# Patient Record
Sex: Female | Born: 2000 | Race: Black or African American | Hispanic: No | Marital: Single | Smoking: Never smoker
Health system: Southern US, Community
[De-identification: ages and names within clinical notes are randomized; demographics above are authoritative.]

## PROBLEM LIST (undated history)

## (undated) DIAGNOSIS — E669 Obesity, unspecified: Secondary | ICD-10-CM

## (undated) DIAGNOSIS — J45909 Unspecified asthma, uncomplicated: Secondary | ICD-10-CM

## (undated) HISTORY — DX: Unspecified asthma, uncomplicated: J45.909

## (undated) HISTORY — DX: Obesity, unspecified: E66.9

---
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
## 2020-09-19 ENCOUNTER — Other Ambulatory Visit: Payer: Self-pay

## 2020-09-19 ENCOUNTER — Ambulatory Visit (INDEPENDENT_AMBULATORY_CARE_PROVIDER_SITE_OTHER): Payer: Medicaid Other | Admitting: Pediatrics

## 2020-09-19 ENCOUNTER — Other Ambulatory Visit (HOSPITAL_COMMUNITY)
Admission: RE | Admit: 2020-09-19 | Discharge: 2020-09-19 | Disposition: A | Discharge status: Home / Self Care | Payer: Medicaid Other | Source: Ambulatory Visit | Attending: Pediatrics | Admitting: Pediatrics

## 2020-09-19 VITALS — BP 112/78 | HR 82 | Ht 66.54 in | Wt 185.8 lb

## 2020-09-19 DIAGNOSIS — A6004 Herpesviral vulvovaginitis: Secondary | ICD-10-CM | POA: Diagnosis not present

## 2020-09-19 DIAGNOSIS — Z113 Encounter for screening for infections with a predominantly sexual mode of transmission: Secondary | ICD-10-CM | POA: Insufficient documentation

## 2020-09-19 DIAGNOSIS — R55 Syncope and collapse: Secondary | ICD-10-CM

## 2020-09-19 DIAGNOSIS — E6609 Other obesity due to excess calories: Secondary | ICD-10-CM | POA: Diagnosis not present

## 2020-09-19 DIAGNOSIS — Z1389 Encounter for screening for other disorder: Secondary | ICD-10-CM | POA: Diagnosis not present

## 2020-09-19 DIAGNOSIS — Z2821 Immunization not carried out because of patient refusal: Secondary | ICD-10-CM

## 2020-09-19 DIAGNOSIS — Z00121 Encounter for routine child health examination with abnormal findings: Secondary | ICD-10-CM | POA: Diagnosis not present

## 2020-09-19 LAB — POCT URINALYSIS DIPSTICK
Bilirubin, UA: NEGATIVE
Blood, UA: POSITIVE
Glucose, UA: NEGATIVE
Ketones, UA: NEGATIVE
Nitrite, UA: NEGATIVE
Protein, UA: POSITIVE — AB
Spec Grav, UA: 1.025 (ref 1.010–1.025)
Urobilinogen, UA: NEGATIVE E.U./dL — AB
pH, UA: 6 (ref 5.0–8.0)

## 2020-09-19 LAB — POCT RAPID HIV: Rapid HIV, POC: NEGATIVE

## 2020-09-19 LAB — LIPID PANEL
Total CHOL/HDL Ratio: 3.3 (calc) (ref <5.0)
Triglycerides: 61 mg/dL (ref <90)

## 2020-09-19 MED ORDER — VALACYCLOVIR HCL 1 G PO TABS: ORAL_TABLET | ORAL | 3 refills | Status: DC

## 2020-09-19 NOTE — Progress Notes (Signed)
Adolescent Well Care Visit Eileen Fox is a 20 y.o. female who is here for well care.     PCP:  Marca Ancona, MD (Inactive)   History was provided by the patient.  Confidentiality was discussed with the patient and, if applicable, with caregiver.[phone]   Current Issues: Current concerns include  No longer actively trying to get pregnant. Decided to wait a bit. Feels that she is stable financially (has her own house and car) but that feels waiting my be best.  Partner is supportive. She is 28.  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  Most concerning is episodes of syncope x 2. First 3/21. Then 2 months ago. Describes that she was standing in the kitchen cooking, felt palpitations and dizziness and fell over. Girlfriend states she completely passed out. Woke up and had some tunnel vision. No inciting event (blood, fear, or something that triggered a vasovagal). Otherwise she feels well. Does not feel palpitations usually.   Nutrition: Nutrition/Eating Behaviors: wide variety Adequate calcium in diet?: yes  Sleep:  Sleep: 8 hours  Social Screening: Lives with:  girlfriend Parental relations:  good (mom) but does not live in the same house Activities, Work, and Regulatory affairs officer?: yes see above Concerns regarding behavior with peers?  no  Education: School Grade: attending glasses at [employer]  Patient has a dental home: yes   Confidential social history: Tobacco?  Yes, was smoking a form of tobacco, now not but is smoking MJ occasionally Drugs--denies other drugs including heroin, cocaine  Sexually Active?  Yes, been with partner for 2 years but still would like to be tested for all STDs  Safe at home, in school & in relationships? yes Safe to self?  Yes   Screenings:  The patient completed the Rapid Assessment for Adolescent Preventive Services screening questionnaire and the following topics were identified as risk factors and discussed:  exercise, marijuana use and drug use  In addition, the following topics were discussed as part of anticipatory guidance: pregnancy prevention, depression/anxiety.  PHQ-9 completed and results indicated 2, no signs of depression. She states she was diagnosed with bipolar, anxiety, and was placed on a medicine that began with a "d"--she thinks depakote when I bring up the medication. Rarely takes it.   Physical Exam:  Vitals:   09/19/20 1336  BP: 112/78  Pulse: 82  SpO2: 97%  Weight: 185 lb 12.8 oz (84.3 kg)  Height: 5' 6.54" (1.69 m)   BP 112/78   Pulse 82   Ht 5' 6.54" (1.69 m)   Wt 185 lb 12.8 oz (84.3 kg)   SpO2 97%   BMI 29.51 kg/m  Body mass index: body mass index is 29.51 kg/m. Blood pressure percentiles are not available for patients who are 18 years or older.   Hearing Screening   125Hz  250Hz  500Hz  1000Hz  2000Hz  3000Hz  4000Hz  6000Hz  8000Hz   Right ear:   20 20 20  20     Left ear:   20 20 20  20       Visual Acuity Screening   Right eye Left eye Both eyes  Without correction:     With correction: 20/16 20/16 20/16     General: well developed, no acute distress, overweight HEENT: PERRL, normal oropharynx, TMs normal bilaterally Neck: supple, no lymphadenopathy CV: RRR no murmur noted PULM: normal aeration throughout all lung fields, no crackles or wheezes Abdomen: soft, non-tender; no masses or HSM Extremities: warm and well perfused Gu: SMR stage 4 Skin:  no rash Neuro: alert and oriented, moves all extremities equally   Assessment and Plan:  Eileen Fox is a 20 y.o. female who is here for well care.   #Well teen: -BMI is not appropriate for age -Discussed anticipatory guidance including pregnancy/STI prevention, alcohol/drug use, safety in the car and around water -Screens: Hearing screening result:normal; Vision screening result: normal  - Discussed need for transition to adult care. List provided.   #STD screening:  Orders Placed This Encounter   Procedures  . WET PREP BY MOLECULAR PROBE  . Urine Culture  . RPR  . HIV antibody (with reflex)                 . POCT Urinalysis Dipstick   #Syncopal episodes: unclear etiology. Does not seem vasovagal. Recommended cardiology referral and evaluation to ensure not cardiac in nature. Mom has had multiple strokes. Did also obtain lipid panel.  - referral to cardiology  #Mood concerns: discussed to continue with current therapy at [facility] and psychiatry - encouraged Ioana to talk to her provider about medication safety and pregnancy  #HSV: on valtrex PRN for outbreaks - refills provided     Return for will be transitioning to adult clinic.Lady Deutscher, MD

## 2020-09-19 NOTE — Patient Instructions (Signed)
Adult Primary Care Clinics Name Criteria Services   [facility]  Address: [address] [place], [place] [address]  Phone: [phone] Hours: Monday - Friday 9 AM -6 PM  Types of insurance accepted:  Commercial insurance [facility] (orange card) Medicaid Medicare Uninsured  Language services:  Video and phone interpreters available   Ages 18 and older    Adult primary care Onsite pharmacy Integrated behavioral health Financial assistance counseling Walk-in hours for established patients  Financial assistance counseling hours: Tuesdays 2:00PM - 5:00PM  Thursday 8:30AM - 4:30PM  Space is limited, 10 on Tuesday and 20 on Thursday. It's on first come first serve basis  Name Criteria Services   [facility]  Address: [address] [place], [place] [address]  Phone: [phone]  Hours: Monday - Friday 8:30 AM - 5 PM  Types of insurance accepted:  Commercial insurance Medicaid Medicare Uninsured  Language services:  Video and phone interpreters available   All ages - newborn to adult   Primary care for all ages (children and adults) Integrated behavioral health Nutritionist Financial assistance counseling   Name Criteria Services   [facility]  Located on the ground floor of [facility]  Address: [address]  [place],  [place]  [address]  Phone: [phone]  Hours: Monday - Friday 8:15 AM - 5 PM  Types of insurance accepted:  Commercial insurance Medicaid Medicare Uninsured  Language services:  Video and phone interpreters available   Ages 18 and older   Adult primary care Nutritionist Certified Diabetes Educator  Integrated behavioral health Financial assistance counseling   Name Criteria Services   [facility]  Address: [address] [place], [place] [address]  Phone:  [phone]  Hours: Monday - Friday 8:30 AM - 5 PM    Types of insurance accepted:  Commercial insurance Medicaid Medicare Uninsured  Language services:  Video and phone interpreters available   All ages - newborn to adult   Primary care for all ages (children and adults) Integrated behavioral health Financial assistance counseling    

## 2020-09-20 LAB — URINE CULTURE
MICRO NUMBER:: 11558503
Result:: NO GROWTH
SPECIMEN QUALITY:: ADEQUATE

## 2020-09-20 LAB — WET PREP BY MOLECULAR PROBE
Candida species: NOT DETECTED
MICRO NUMBER:: 11558505
SPECIMEN QUALITY:: ADEQUATE
Trichomonas vaginosis: NOT DETECTED

## 2020-09-21 LAB — URINE CYTOLOGY ANCILLARY ONLY
Chlamydia: NEGATIVE
Comment: NEGATIVE
Comment: NEGATIVE
Comment: NORMAL
Neisseria Gonorrhea: NEGATIVE
Trichomonas: NEGATIVE

## 2020-09-21 LAB — RPR: RPR Ser Ql: NONREACTIVE

## 2020-09-21 LAB — LIPID PANEL
Cholesterol: 169 mg/dL (ref <170)
HDL: 51 mg/dL (ref >45)
LDL Cholesterol (Calc): 103 mg/dL (calc) (ref <110)
Non-HDL Cholesterol (Calc): 118 mg/dL (calc) (ref <120)

## 2020-09-21 LAB — VITAMIN D 25 HYDROXY (VIT D DEFICIENCY, FRACTURES): Vit D, 25-Hydroxy: 15 ng/mL — ABNORMAL LOW (ref 30–100)

## 2020-09-21 LAB — HEMOGLOBIN A1C
Hgb A1c MFr Bld: 5.2 % of total Hgb (ref <5.7)
Mean Plasma Glucose: 103 mg/dL
eAG (mmol/L): 5.7 mmol/L

## 2020-09-21 LAB — HIV ANTIBODY (ROUTINE TESTING W REFLEX): HIV 1&2 Ab, 4th Generation: NONREACTIVE

## 2020-09-28 ENCOUNTER — Other Ambulatory Visit: Payer: Self-pay | Admitting: Pediatrics

## 2020-09-28 DIAGNOSIS — R55 Syncope and collapse: Secondary | ICD-10-CM

## 2020-10-14 ENCOUNTER — Ambulatory Visit: Payer: Medicaid Other | Admitting: Cardiovascular Disease

## 2020-10-14 NOTE — Progress Notes (Deleted)
Cardiology Office Note:   Date:  10/14/2020  NAME:  Eileen Fox    MRN: [id number] DOB:  [birth year]   PCP:  Marca Ancona, MD (Inactive)  Cardiologist:  No primary care provider on file.  Electrophysiologist:  None   Referring MD: Lady Deutscher, MD   No chief complaint on file. ***  History of Present Illness:   Eileen Fox is a 20 y.o. female with a hx of obesity who is being seen today for the evaluation of syncope at the request of Lady Deutscher, MD.  Past Medical History: Past Medical History:  Diagnosis Date  . Asthma   . Obesity     Past Surgical History: No past surgical history on file.  Current Medications: No outpatient medications have been marked as taking for the 10/14/20 encounter (Appointment) with O'Neal, Ronnald Ramp, MD.     Allergies:    Apricot kernel oil [prunus] and Coconut oil   Social History: Social History   Socioeconomic History  . Marital status: Single    Spouse name: Not on file  . Number of children: Not on file  . Years of education: Not on file  . Highest education level: Not on file  Occupational History  . Not on file  Tobacco Use  . Smoking status: Current Some Day Smoker  . Smokeless tobacco: Never Used  Substance and Sexual Activity  . Alcohol use: Not Currently    Alcohol/week: 0.0 standard drinks  . Drug use: Yes  . Sexual activity: Yes  Other Topics Concern  . Not on file  Social History Narrative  . Not on file   Social Determinants of Health   Financial Resource Strain: Not on file  Food Insecurity: Not on file  Transportation Needs: Not on file  Physical Activity: Not on file  Stress: Not on file  Social Connections: Not on file     Family History: The patient's ***family history is not on file.  ROS:   All other ROS reviewed and negative. Pertinent positives noted in the HPI.     EKGs/Labs/Other Studies Reviewed:   The following studies were personally reviewed by me today:  EKG:   EKG is *** ordered today.  The ekg ordered today demonstrates ***, and was personally reviewed by me.   Recent Labs: No results found for requested labs within last 8760 hours.   Recent Lipid Panel    Component Value Date/Time   CHOL 169 09/19/2020 1419   TRIG 61 09/19/2020 1419   HDL 51 09/19/2020 1419   CHOLHDL 3.3 09/19/2020 1419   VLDL 19 12/11/2016 1710   LDLCALC 103 09/19/2020 1419    Physical Exam:   VS:  There were no vitals taken for this visit.   Wt Readings from Last 3 Encounters:  09/19/20 185 lb 12.8 oz (84.3 kg) (96 %, Z= 1.70)*  06/09/19 213 lb (96.6 kg) (98 %, Z= 2.13)*  06/02/18 207 lb (93.9 kg) (98 %, Z= 2.08)*   * Growth percentiles are based on CDC (Girls, 2-20 Years) data.    General: Well nourished, well developed, in no acute distress Head: Atraumatic, normal size  Eyes: PEERLA, EOMI  Neck: Supple, no JVD Endocrine: No thryomegaly Cardiac: Normal S1, S2; RRR; no murmurs, rubs, or gallops Lungs: Clear to auscultation bilaterally, no wheezing, rhonchi or rales  Abd: Soft, nontender, no hepatomegaly  Ext: No edema, pulses 2+ Musculoskeletal: No deformities, BUE and BLE strength normal and equal Skin: Warm and dry, no rashes  Neuro: Alert and oriented to person, place, time, and situation, CNII-XII grossly intact, no focal deficits  Psych: Normal mood and affect   ASSESSMENT:   Eileen Fox is a 20 y.o. female who presents for the following: No diagnosis found.  PLAN:   There are no diagnoses linked to this encounter.  Disposition: No follow-ups on file.  Medication Adjustments/Labs and Tests Ordered: Current medicines are reviewed at length with the patient today.  Concerns regarding medicines are outlined above.  No orders of the defined types were placed in this encounter.  No orders of the defined types were placed in this encounter.   There are no Patient Instructions on file for this visit.   Time Spent with Patient: I have spent a  total of *** minutes with patient reviewing hospital notes, telemetry, EKGs, labs and examining the patient as well as establishing an assessment and plan that was discussed with the patient.  > 50% of time was spent in direct patient care.  Signed, Lenna Gilford. Flora Lipps, MD, [facility]  [facility]  [address] [place], [place] [address] [phone]  10/14/2020 6:41 AM

## 2020-12-01 ENCOUNTER — Ambulatory Visit: Payer: Medicaid Other | Admitting: Cardiovascular Disease

## 2020-12-01 NOTE — Progress Notes (Deleted)
Cardiology Office Note:   Date:  12/01/2020  NAME:  Eileen Fox    MRN: [id number] DOB:  [birth year]   PCP:  Marca Ancona, MD (Inactive)  Cardiologist:  None  Electrophysiologist:  None   Referring MD: Lady Deutscher, MD   No chief complaint on file. ***  History of Present Illness:   Eileen Fox is a 20 y.o. female with a hx of asthma who is being seen today for the evaluation of syncope at the request of Lady Deutscher, MD.  A1c 5.2  Past Medical History: Past Medical History:  Diagnosis Date  . Asthma   . Obesity     Past Surgical History: No past surgical history on file.  Current Medications: No outpatient medications have been marked as taking for the 12/01/20 encounter (Appointment) with O'Neal, Ronnald Ramp, MD.     Allergies:    Apricot kernel oil [prunus] and Coconut oil   Social History: Social History   Socioeconomic History  . Marital status: Single    Spouse name: Not on file  . Number of children: Not on file  . Years of education: Not on file  . Highest education level: Not on file  Occupational History  . Not on file  Tobacco Use  . Smoking status: Current Some Day Smoker  . Smokeless tobacco: Never Used  Substance and Sexual Activity  . Alcohol use: Not Currently    Alcohol/week: 0.0 standard drinks  . Drug use: Yes  . Sexual activity: Yes  Other Topics Concern  . Not on file  Social History Narrative  . Not on file   Social Determinants of Health   Financial Resource Strain: Not on file  Food Insecurity: Not on file  Transportation Needs: Not on file  Physical Activity: Not on file  Stress: Not on file  Social Connections: Not on file     Family History: The patient's ***family history is not on file.  ROS:   All other ROS reviewed and negative. Pertinent positives noted in the HPI.     EKGs/Labs/Other Studies Reviewed:   The following studies were personally reviewed by me today:  EKG:  EKG is *** ordered  today.  The ekg ordered today demonstrates ***, and was personally reviewed by me.   Recent Labs: No results found for requested labs within last 8760 hours.   Recent Lipid Panel    Component Value Date/Time   CHOL 169 09/19/2020 1419   TRIG 61 09/19/2020 1419   HDL 51 09/19/2020 1419   CHOLHDL 3.3 09/19/2020 1419   VLDL 19 12/11/2016 1710   LDLCALC 103 09/19/2020 1419    Physical Exam:   VS:  There were no vitals taken for this visit.   Wt Readings from Last 3 Encounters:  09/19/20 185 lb 12.8 oz (84.3 kg) (96 %, Z= 1.70)*  06/09/19 213 lb (96.6 kg) (98 %, Z= 2.13)*  06/02/18 207 lb (93.9 kg) (98 %, Z= 2.08)*   * Growth percentiles are based on CDC (Girls, 2-20 Years) data.    General: Well nourished, well developed, in no acute distress Head: Atraumatic, normal size  Eyes: PEERLA, EOMI  Neck: Supple, no JVD Endocrine: No thryomegaly Cardiac: Normal S1, S2; RRR; no murmurs, rubs, or gallops Lungs: Clear to auscultation bilaterally, no wheezing, rhonchi or rales  Abd: Soft, nontender, no hepatomegaly  Ext: No edema, pulses 2+ Musculoskeletal: No deformities, BUE and BLE strength normal and equal Skin: Warm and dry, no rashes   Neuro: Alert  and oriented to person, place, time, and situation, CNII-XII grossly intact, no focal deficits  Psych: Normal mood and affect   ASSESSMENT:   Eileen Fox is a 20 y.o. female who presents for the following: No diagnosis found.  PLAN:   There are no diagnoses linked to this encounter.  Disposition: No follow-ups on file.  Medication Adjustments/Labs and Tests Ordered: Current medicines are reviewed at length with the patient today.  Concerns regarding medicines are outlined above.  No orders of the defined types were placed in this encounter.  No orders of the defined types were placed in this encounter.   There are no Patient Instructions on file for this visit.   Time Spent with Patient: I have spent a total of *** minutes  with patient reviewing hospital notes, telemetry, EKGs, labs and examining the patient as well as establishing an assessment and plan that was discussed with the patient.  > 50% of time was spent in direct patient care.  Signed, Lenna Gilford. Flora Lipps, MD, [facility]  [facility]  [address], [address] [place], [place] [address] [phone]  12/01/2020 6:46 AM

## 2020-12-08 ENCOUNTER — Encounter: Payer: Self-pay | Admitting: *Deleted

## 2021-06-21 ENCOUNTER — Emergency Department (HOSPITAL_COMMUNITY)
Admission: EM | Admit: 2021-06-21 | Discharge: 2021-06-21 | Disposition: A | Discharge status: Home / Self Care | Payer: Medicaid Other | Attending: Emergency Medicine | Admitting: Emergency Medicine

## 2021-06-21 ENCOUNTER — Encounter (HOSPITAL_COMMUNITY): Payer: Self-pay

## 2021-06-21 DIAGNOSIS — J45909 Unspecified asthma, uncomplicated: Secondary | ICD-10-CM | POA: Insufficient documentation

## 2021-06-21 DIAGNOSIS — H9202 Otalgia, left ear: Secondary | ICD-10-CM | POA: Diagnosis present

## 2021-06-21 DIAGNOSIS — F17200 Nicotine dependence, unspecified, uncomplicated: Secondary | ICD-10-CM | POA: Diagnosis not present

## 2021-06-21 DIAGNOSIS — R059 Cough, unspecified: Secondary | ICD-10-CM | POA: Diagnosis not present

## 2021-06-21 DIAGNOSIS — H6692 Otitis media, unspecified, left ear: Secondary | ICD-10-CM | POA: Insufficient documentation

## 2021-06-21 DIAGNOSIS — H66012 Acute suppurative otitis media with spontaneous rupture of ear drum, left ear: Secondary | ICD-10-CM

## 2021-06-21 DIAGNOSIS — R0981 Nasal congestion: Secondary | ICD-10-CM | POA: Insufficient documentation

## 2021-06-21 MED ORDER — NEOMYCIN-COLIST-HC-THONZONIUM 3.3-3-10-0.5 MG/ML OT SUSP
3.0000 [drp] | Freq: Four times a day (QID) | OTIC | Status: DC
  Filled 2021-06-21: qty 10

## 2021-06-21 MED ORDER — IBUPROFEN 400 MG PO TABS
600.0000 mg | ORAL_TABLET | Freq: Once | ORAL | Status: AC
  Administered 2021-06-21: 600 mg via ORAL
  Filled 2021-06-21: qty 1

## 2021-06-21 MED ORDER — NEOMYCIN-POLYMYXIN-HC 3.5-10000-1 OT SOLN
3.0000 [drp] | Freq: Four times a day (QID) | OTIC | Status: DC
  Administered 2021-06-21: 3 [drp] via OTIC
  Filled 2021-06-21: qty 10

## 2021-06-21 MED ORDER — AMOXICILLIN 500 MG PO CAPS
1000.0000 mg | ORAL_CAPSULE | Freq: Once | ORAL | Status: AC
  Administered 2021-06-21: 1000 mg via ORAL
  Filled 2021-06-21: qty 2

## 2021-06-21 MED ORDER — AMOXICILLIN 500 MG PO CAPS: 1000.0000 mg | ORAL_CAPSULE | Freq: Two times a day (BID) | ORAL | 0 refills | Status: AC

## 2021-06-21 NOTE — ED Provider Notes (Signed)
[facility] EMERGENCY DEPARTMENT Provider Note   CSN: [id number] Arrival date & time: 06/21/21  2209     History Chief Complaint  Patient presents with   Otalgia    Eileen Fox is a 20 y.o. female.  Patient to ED with progressively worsening left ear pain x 2 days. She reports sharp pain, and muffled hearing. No injury. She has mild congestion with dry cough. No sore throat, fever.   The history is provided by the patient. No language interpreter was used.  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 No pertinent surgical history.   OB History   No obstetric history on file.     No family history on file.  Social History   Tobacco Use   Smoking status: Some Days   Smokeless tobacco: Never  Substance Use Topics   Alcohol use: Not Currently    Alcohol/week: 0.0 standard drinks   Drug use: Yes    Home Medications Prior to Admission medications   Medication Sig Start Date End Date Taking? Authorizing Provider  MedroxyPROGESTERone Acetate (DEPO-PROVERA IM) Inject 1 application into the muscle.    [provider]  valACYclovir (VALTREX) 1000 MG tablet TAKE 1 TABLET BY MOUTH DAILY FOR 5 DAYS. TAKE WITH INITIAL TINGLING/FIRST SYMPTOMS 09/19/20   Lady Deutscher, MD    Allergies    Apricot kernel oil [prunus] and Coconut oil  Review of Systems   Review of Systems  Constitutional:  Negative for fever.   HENT:  Positive for congestion, ear discharge and ear pain. Negative for sore throat.   Respiratory:  Positive for cough.   Gastrointestinal:  Negative for nausea.   Physical Exam Updated Vital Signs BP 136/72 (BP Location: Right Arm)   Pulse 73   Temp 99 F (37.2 C) (Oral)   Resp 18   SpO2 100%   Physical Exam Vitals and nursing note reviewed.  Constitutional:      General: She is not in acute distress.    Appearance: She is well-developed. She is not ill-appearing.  HENT:     Right Ear: Tympanic membrane and ear canal normal.     Ears:     Comments: Left TM obscurred by purulent material in the external canal. Pain with external ear movement. No pre- or post-auricular lymph swelling.  Pulmonary:     Effort: Pulmonary effort is normal.  Musculoskeletal:        General: Normal range of motion.     Cervical back: Normal range of motion.  Skin:    General: Skin is warm and dry.  Neurological:     Mental Status: She is alert and oriented to person, place, and time.    ED Results / Procedures / Treatments   Labs (all labs ordered are listed, but only abnormal results are displayed) Labs Reviewed - No data to display  EKG None  Radiology No results found.  Procedures Procedures   Medications Ordered in ED Medications - No data to display  ED Course  I have reviewed the triage vital signs and the nursing notes.  Pertinent labs & imaging results that were available during my care of the patient were reviewed by me and considered in my medical decision making (see chart for details).    MDM Rules/Calculators/A&P                           Patient to ED with left ear pain x 2 days.   Left ear canal has copious purulent discharge. Will start on abx. She does not currently have a PCP. Will refer to ENT if symptoms persist.   Final Clinical Impression(s) / ED Diagnoses Final diagnoses:  None   Left otitis  Rx / DC Orders ED Discharge Orders     None         Danne Harbor 06/21/21 2244    Linwood Dibbles, MD 06/25/21 [phone]

## 2021-06-21 NOTE — ED Triage Notes (Signed)
Pt states that has been having L ear pain for the past 2 days and has been leaking yellow fluid today

## 2022-08-09 ENCOUNTER — Ambulatory Visit
Admission: EM | Admit: 2022-08-09 | Discharge: 2022-08-09 | Disposition: A | Discharge status: Home / Self Care | Payer: Medicaid Other | Attending: Urgent Care | Admitting: Urgent Care

## 2022-08-09 DIAGNOSIS — A6000 Herpesviral infection of urogenital system, unspecified: Secondary | ICD-10-CM

## 2022-08-09 DIAGNOSIS — Z760 Encounter for issue of repeat prescription: Secondary | ICD-10-CM | POA: Diagnosis not present

## 2022-08-09 DIAGNOSIS — H1033 Unspecified acute conjunctivitis, bilateral: Secondary | ICD-10-CM | POA: Diagnosis not present

## 2022-08-09 DIAGNOSIS — A6004 Herpesviral vulvovaginitis: Secondary | ICD-10-CM | POA: Diagnosis not present

## 2022-08-09 MED ORDER — OLOPATADINE HCL 0.1 % OP SOLN: 1.0000 [drp] | Freq: Two times a day (BID) | OPHTHALMIC | 12 refills | Status: AC

## 2022-08-09 MED ORDER — VALACYCLOVIR HCL 1 G PO TABS: ORAL_TABLET | ORAL | 5 refills | Status: AC

## 2022-08-09 MED ORDER — TOBRAMYCIN 0.3 % OP SOLN: 1.0000 [drp] | OPHTHALMIC | 0 refills | Status: AC

## 2022-08-09 NOTE — ED Provider Notes (Signed)
[name]  Note:  This document was prepared using Systems analyst and may include unintentional dictation errors.  MRN: [id number] DOB: 29-Dec-2000  Subjective:   Eileen Fox is a 22 y.o. female presenting for 1 day history of acute onset persistent and worsening irritation, redness of both eyes.  Patient woke up with her eyes matted shut.  No vision changes, eyelid pain, photophobia.  Wears contact lenses but has not been using them since she got these particular eye symptoms.  No eyelash extensions, new make-up or eyeliner, mascara.  Patient would also like a refill of her valacyclovir.  Has genital herpes.  Had an outbreak about a month and a half ago.  Always likes to have this on hand in the event that she gets an [place].  No current facility-administered medications for this encounter.  Current Outpatient Medications:    olopatadine (PATANOL) 0.1 % ophthalmic solution, Place 1 drop into both eyes 2 (two) times daily., Disp: 5 mL, Rfl: 12   tobramycin (TOBREX) 0.3 % ophthalmic solution, Place 1 drop into both eyes every 4 (four) hours., Disp: 5 mL, Rfl: 0   amoxicillin (AMOXIL) 500 MG capsule, Take 2 capsules (1,000 mg total) by mouth 2 (two) times daily., Disp: 40 capsule, Rfl: 0   MedroxyPROGESTERone Acetate (DEPO-PROVERA IM), Inject 1 application into the muscle., Disp: , Rfl:    valACYclovir (VALTREX) 1000 MG tablet, TAKE 1 TABLET BY MOUTH DAILY FOR 5 DAYS. TAKE WITH INITIAL TINGLING/FIRST SYMPTOMS, Disp: 5 tablet, Rfl: 3   Allergies  Allergen Reactions   Apricot Kernel Oil [Prunus] Anaphylaxis, Swelling and Other (See Comments)    Burning of tongue, swelling of lips and tongue    Coconut (Cocos Nucifera) Anaphylaxis, Swelling and Other (See Comments)    Burning of tongue, swelling of lips and tongue     Past Medical History:  Diagnosis Date   Asthma    Obesity      History reviewed. No pertinent surgical history.  No  family history on file.  Social History   Tobacco Use   Smoking status: Never   Smokeless tobacco: Never  Vaping Use   Vaping Use: Every day  Substance Use Topics   Alcohol use: Not Currently    Alcohol/week: 0.0 standard drinks of alcohol   Drug use: Not Currently    ROS   Objective:   Vitals: BP 124/73 (BP Location: Right Arm)   Pulse 77   Temp 98.9 F (37.2 C) (Oral)   Resp 18   LMP 07/28/2022   SpO2 97%   Physical Exam Constitutional:      General: She is not in acute distress.    Appearance: Normal appearance. She is well-developed. She is not ill-appearing, toxic-appearing or diaphoretic.  HENT:     Head: Normocephalic and atraumatic.     Nose: Nose normal.     Mouth/Throat:     Mouth: Mucous membranes are moist.  Eyes:     General: Lids are normal. Lids are everted, no foreign bodies appreciated. Vision grossly intact. No scleral icterus.       Right eye: No foreign body, discharge or hordeolum.        Left eye: No foreign body, discharge or hordeolum.     Extraocular Movements: Extraocular movements intact.     Right eye: Normal extraocular motion.     Left eye: Normal extraocular motion and no nystagmus.     Conjunctiva/sclera:     Right eye: Right  conjunctiva is injected. No chemosis, exudate or hemorrhage.    Left eye: Left conjunctiva is injected. No chemosis, exudate or hemorrhage.    Pupils: Pupils are equal, round, and reactive to light.  Cardiovascular:     Rate and Rhythm: Normal rate.  Pulmonary:     Effort: Pulmonary effort is normal.  Skin:    General: Skin is warm and dry.  Neurological:     General: No focal deficit present.     Mental Status: She is alert and oriented to person, place, and time.  Psychiatric:        Mood and Affect: Mood normal.        Behavior: Behavior normal.        Thought Content: Thought content normal.        Judgment: Judgment normal.     Assessment and Plan :   PDMP not reviewed this encounter.  1.  Acute conjunctivitis of both eyes, unspecified acute conjunctivitis type   2. Genital herpes simplex, unspecified site   3. Herpes simplex vulvovaginitis   4. Encounter for medication refill     Recommended double coverage for allergic and bacterial conjunctivitis with olopatadine and tobramycin.  Anticipatory guidance provided.  I refilled her medication for herpes simplex of the genital area.  She is to take valacyclovir for this as needed for outbreaks. Counseled patient on potential for adverse effects with medications prescribed/recommended today, ER and return-to-clinic precautions discussed, patient verbalized understanding.    Jaynee Eagles, PA-C 08/09/22 1949

## 2022-08-09 NOTE — Discharge Instructions (Signed)
Use tobramycin drops for the next 1 week. Then you can stop. This can address bacterial conjunctivitis. Use olopatadine at the same to address irritant/allergic conjunctivitis. This can be used more long term but only if you need it.

## 2022-08-09 NOTE — ED Triage Notes (Addendum)
Pt with redness, drainage, itching both eyes started yesterday-states her work sent her home for possible pink eye-NAD-steady gait

## 2023-07-17 ENCOUNTER — Ambulatory Visit
Admission: EM | Admit: 2023-07-17 | Discharge: 2023-07-17 | Disposition: A | Discharge status: Home / Self Care | Payer: Medicaid Other

## 2023-07-17 DIAGNOSIS — Z0289 Encounter for other administrative examinations: Secondary | ICD-10-CM

## 2023-07-17 NOTE — ED Triage Notes (Signed)
Pt presents for a work note.   States she recently recovered from the stomach virus, does not have c/o any sxs.

## 2023-07-17 NOTE — ED Provider Notes (Signed)
[facility]    CSN: [id number] Arrival date & time: 07/17/23  1303      History   Chief Complaint Chief Complaint  Patient presents with   work note    HPI Eileen Fox is a 22 y.o. female presents for work note.  Patient reports 2 days ago she developed headache with vomiting.  States this lasted about 24 hours and all of her symptoms have completely resolved.  She presents primarily for a work note for the days that she missed.  No fevers, hematochezia, diarrhea, abdominal pain.  No history of GI diagnoses such as Crohn's or IBS.  Reports partner had same symptoms.  No other concerns at this time.  HPI  Past Medical History:  Diagnosis Date   Asthma    Obesity     Patient Active Problem List   Diagnosis Date Noted   BMI (body mass index), pediatric, greater than or equal to 95% for age 27/04/2019   Idiopathic sleep related nonobstructive alveolar hypoventilation 12/11/2016   At risk for anemia 12/11/2016   Heartburn 09/19/2016   Acne vulgaris 06/26/2014   Menstrual cramp 05/10/2014   Asthma 04/13/2013   Obesity 04/13/2013    History reviewed. No pertinent surgical history.  OB History   No obstetric history on file.      Home Medications    Prior to Admission medications   Medication Sig Start Date End Date Taking? Authorizing Provider  amoxicillin (AMOXIL) 500 MG capsule Take 2 capsules (1,000 mg total) by mouth 2 (two) times daily. 06/21/21   Elpidio Anis, PA-C  MedroxyPROGESTERone Acetate (DEPO-PROVERA IM) Inject 1 application into the muscle.    [provider]  olopatadine (PATANOL) 0.1 % ophthalmic solution Place 1 drop into both eyes 2 (two) times daily. 08/09/22   Wallis Bamberg, PA-C  tobramycin (TOBREX) 0.3 % ophthalmic solution Place 1 drop into both eyes every 4 (four) hours. 08/09/22   Wallis Bamberg, PA-C  valACYclovir (VALTREX) 1000 MG tablet At the start of an outbreak take 1 tablet daily for 5 days. 08/09/22   Wallis Bamberg, PA-C     Family History History reviewed. No pertinent family history.  Social History Social History   Tobacco Use   Smoking status: Never   Smokeless tobacco: Never  Vaping Use   Vaping status: Every Day  Substance Use Topics   Alcohol use: Not Currently    Alcohol/week: 0.0 standard drinks of alcohol   Drug use: Not Currently     Allergies   Apricot kernel oil [prunus] and Coconut (cocos nucifera)   Review of Systems Review of Systems  Constitutional:        Encounter for work note     Physical Exam Triage Vital Signs ED Triage Vitals  Encounter Vitals Group     BP 07/17/23 1323 136/84     Systolic BP Percentile --      Diastolic BP Percentile --      Pulse Rate 07/17/23 1323 86     Resp 07/17/23 1323 17     Temp 07/17/23 1323 99.1 F (37.3 C)     Temp Source 07/17/23 1323 Oral     SpO2 07/17/23 1323 97 %     Weight --      Height --      Head Circumference --      Peak Flow --      Pain Score 07/17/23 1321 0     Pain Loc --  Pain Education --      Exclude from Growth Chart --    No data found.  Updated Vital Signs BP 136/84 (BP Location: Left Arm)   Pulse 86   Temp 99.1 F (37.3 C) (Oral)   Resp 17   LMP 07/03/2023 (Exact Date)   SpO2 97%   Visual Acuity Right Eye Distance:   Left Eye Distance:   Bilateral Distance:    Right Eye Near:   Left Eye Near:    Bilateral Near:     Physical Exam Vitals and nursing note reviewed.  Constitutional:      General: She is not in acute distress.    Appearance: Normal appearance. She is not ill-appearing.  HENT:     Head: Normocephalic and atraumatic.  Eyes:     Pupils: Pupils are equal, round, and reactive to light.  Cardiovascular:     Rate and Rhythm: Normal rate.  Pulmonary:     Effort: Pulmonary effort is normal.  Abdominal:     General: Bowel sounds are normal. There is no distension.     Palpations: Abdomen is soft.     Tenderness: There is no abdominal tenderness.  Skin:     General: Skin is warm and dry.  Neurological:     General: No focal deficit present.     Mental Status: She is alert and oriented to person, place, and time.  Psychiatric:        Mood and Affect: Mood normal.        Behavior: Behavior normal.      UC Treatments / Results  Labs (all labs ordered are listed, but only abnormal results are displayed) Labs Reviewed - No data to display  EKG   Radiology No results found.  Procedures Procedures (including critical care time)  Medications Ordered in UC Medications - No data to display  Initial Impression / Assessment and Plan / UC Course  I have reviewed the triage vital signs and the nursing notes.  Pertinent labs & imaging results that were available during my care of the patient were reviewed by me and considered in my medical decision making (see chart for details).     Patient presenting for work note after reported viral gastroenteritis x 24 hours.  States symptoms have completely resolved.  Work note provided.  Follow-up as needed. Final Clinical Impressions(s) / UC Diagnoses   Final diagnoses:  Encounter to obtain excuse from work   Discharge Instructions   None    ED Prescriptions   None    PDMP not reviewed this encounter.   Radford Pax, NP 07/17/23 1339

## 2024-08-28 ENCOUNTER — Encounter (HOSPITAL_BASED_OUTPATIENT_CLINIC_OR_DEPARTMENT_OTHER): Payer: Self-pay | Admitting: Emergency Medicine

## 2024-08-28 ENCOUNTER — Emergency Department (HOSPITAL_BASED_OUTPATIENT_CLINIC_OR_DEPARTMENT_OTHER): Payer: Self-pay

## 2024-08-28 ENCOUNTER — Emergency Department (HOSPITAL_BASED_OUTPATIENT_CLINIC_OR_DEPARTMENT_OTHER)
Admission: EM | Admit: 2024-08-28 | Discharge: 2024-08-29 | Disposition: A | Payer: Self-pay | Attending: Emergency Medicine | Admitting: Emergency Medicine

## 2024-08-28 ENCOUNTER — Other Ambulatory Visit: Payer: Self-pay

## 2024-08-28 DIAGNOSIS — J45909 Unspecified asthma, uncomplicated: Secondary | ICD-10-CM | POA: Insufficient documentation

## 2024-08-28 DIAGNOSIS — W108XXA Fall (on) (from) other stairs and steps, initial encounter: Secondary | ICD-10-CM

## 2024-08-28 DIAGNOSIS — W109XXA Fall (on) (from) unspecified stairs and steps, initial encounter: Secondary | ICD-10-CM | POA: Insufficient documentation

## 2024-08-28 DIAGNOSIS — Y990 Civilian activity done for income or pay: Secondary | ICD-10-CM | POA: Insufficient documentation

## 2024-08-28 DIAGNOSIS — S93402A Sprain of unspecified ligament of left ankle, initial encounter: Secondary | ICD-10-CM | POA: Insufficient documentation

## 2024-08-28 NOTE — Discharge Instructions (Addendum)
 Thank you for coming to [facility] Emergency Department. You were seen for a fall on the stairs with left ankle and foot pain. We did an exam, and imaging, and these showed no fracture but likely a severe left ankle sprain.  Please wear the left ankle boot and use crutches and remain nonweightbearing.  You can alternate Tylenol  and ibuprofen  for pain.  Please rest, ice, and elevate your left ankle/foot to help reduce swelling and pain.  Please follow-up with an orthopedic doctor within 1 to 2 weeks if your symptoms have not improved.  Do not hesitate to return to the ED or call 911 if you experience: -Worsening symptoms -Cold and numb foot -Lightheadedness, passing out -Fevers/chills -Anything else that concerns you

## 2024-08-28 NOTE — ED Triage Notes (Signed)
 Pt reports missing step and falling down appx 3 stairs at work tonight. Denies head injury, LOC. C/o L ankle pain.

## 2024-08-28 NOTE — ED Provider Notes (Signed)
 " [facility] Provider Note   CSN: [id number] Arrival date & time: 08/28/24  1957     History {Add pertinent medical, surgical, social history, OB history to HPI:1} Chief Complaint  Patient presents with   Eileen Fox is a 24 y.o. female with PMH as listed below who presents with missing step and falling down appx 3 stairs at work tonight. Denies head injury, LOC. C/o L ankle pain, hasn't been able to walk on it.  .    Past Medical History:  Diagnosis Date   Asthma    Obesity        Home Medications Prior to Admission medications  Medication Sig Start Date End Date Taking? Authorizing Provider  amoxicillin  (AMOXIL ) 500 MG capsule Take 2 capsules (1,000 mg total) by mouth 2 (two) times daily. 06/21/21   Odell Balls, PA-C  MedroxyPROGESTERone  Acetate (DEPO-PROVERA  IM) Inject 1 application into the muscle.    [provider]  olopatadine  (PATANOL) 0.1 % ophthalmic solution Place 1 drop into both eyes 2 (two) times daily. 08/09/22   Christopher Savannah, PA-C  tobramycin  (TOBREX ) 0.3 % ophthalmic solution Place 1 drop into both eyes every 4 (four) hours. 08/09/22   Christopher Savannah, PA-C  valACYclovir  (VALTREX ) 1000 MG tablet At the start of an outbreak take 1 tablet daily for 5 days. 08/09/22   Christopher Savannah, PA-C      Allergies    Apricot kernel oil [prunus] and Coconut (cocos nucifera)    Review of Systems   Review of Systems A 10 point review of systems was performed and is negative unless otherwise reported in HPI.  Physical Exam Updated Vital Signs BP 102/77 (BP Location: Left Arm)   Pulse 81   Temp 98.1 F (36.7 C)   Resp 20   Wt 113.4 kg   LMP 08/12/2024 (Approximate)   SpO2 100%   BMI 39.70 kg/m  Physical Exam General: Normal appearing {Desc; female/female:11659}, lying in bed.  HEENT: PERRLA, Sclera anicteric, MMM, trachea midline.  Cardiology: RRR, no murmurs/rubs/gallops. BL radial and DP pulses equal  bilaterally.  Resp: Normal respiratory rate and effort. CTAB, no wheezes, rhonchi, crackles.  Abd: Soft, non-tender, non-distended. No rebound tenderness or guarding.  GU: Deferred. MSK: No peripheral edema or signs of trauma. Extremities without deformity or TTP. No cyanosis or clubbing. Skin: warm, dry. No rashes or lesions. Back: No CVA tenderness Neuro: A&Ox4, CNs II-XII grossly intact. MAEs. Sensation grossly intact.  Psych: Normal mood and affect.   ED Results / Procedures / Treatments   Labs (all labs ordered are listed, but only abnormal results are displayed) Labs Reviewed - No data to display  EKG None  Radiology DG Ankle Complete Left Result Date: 08/28/2024 EXAM: 3 VIEW(S) XRAY OF THE LEFT ANKLE 08/28/2024 08:22:00 PM CLINICAL HISTORY: Fall. COMPARISON: None available. FINDINGS: BONES AND JOINTS: No acute fracture. No malalignment. SOFT TISSUES: Subcutaneous soft tissue edema. IMPRESSION: 1. No evidence of acute traumatic injury. 2. Subcutaneous soft tissue edema. Electronically signed by: Greig Pique MD 08/28/2024 09:49 PM EST RP Workstation: [id number]    Procedures Procedures  {Document cardiac monitor, telemetry assessment procedure when appropriate:1}  Medications Ordered in ED Medications - No data to display  ED Course/ Medical Decision Making/ A&P                          Medical Decision Making Amount and/or Complexity of Data Reviewed Radiology:  ordered. Decision-making details documented in ED Course.    This patient presents to the ED for concern of fall down the stairs, this involves an extensive number of treatment options, and is a complaint that carries with it a high risk of complications and morbidity.  MDM:    ***  Intact NVI in L ankle/foot No gross signs of trauma except diffuse swelling +TTP over medial and lateral malleolus as well as over 5th metatarsal Negative calcaneal squeeze   Clinical Course as of 08/28/24 2313  Fri Aug 28, 2024  2153 DG Ankle Complete Left 1. No evidence of acute traumatic injury. 2. Subcutaneous soft tissue edema.   [HN]    Clinical Course User Index [HN] Franklyn Sid SAILOR, MD    Imaging Studies ordered: I ordered imaging studies including L ankle/foot XRs I independently visualized and interpreted imaging. I agree with the radiologist interpretation  Additional history obtained from chart review, girlfriend at bedside.   Reevaluation: After the interventions noted above, I reevaluated the patient and found that they have :stayed the same  Social Determinants of Health: Lives independently, works at [employer]  Disposition:  DC w/ discharge instructions/return precautions. All questions answered to patient's satisfaction.    Co morbidities that complicate the patient evaluation  Past Medical History:  Diagnosis Date   Asthma    Obesity      Medicines No orders of the defined types were placed in this encounter.   I have reviewed the patients home medicines and have made adjustments as needed  Problem List / ED Course: Problem List Items Addressed This Visit   None        {Document critical care time when appropriate:1} {Document review of labs and clinical decision tools ie heart score, Chads2Vasc2 etc:1}  {Document your independent review of radiology images, and any outside records:1} {Document your discussion with family members, caretakers, and with consultants:1} {Document social determinants of health affecting pt's care:1} {Document your decision making why or why not admission, treatments were needed:1}  This note was created using dictation software, which may contain spelling or grammatical errors.  "
